# Patient Record
Sex: Male | Born: 1935 | Race: White | Hispanic: No | Marital: Single
Health system: Southern US, Community
[De-identification: ages and names within clinical notes are randomized; demographics above are authoritative.]

---

## 2012-02-24 LAB — CBC
HCT: 40.7 % (ref 40.0–52.0)
HGB: 13 g/dL (ref 13.0–18.0)
MCH: 30.7 pg (ref 26.0–34.0)
MCHC: 31.9 g/dL — ABNORMAL LOW (ref 32.0–36.0)
MCV: 96 fL (ref 80–100)
Platelet: 169 10*3/uL (ref 150–440)
RBC: 4.23 10*6/uL — ABNORMAL LOW (ref 4.40–5.90)
RDW: 18.1 % — ABNORMAL HIGH (ref 11.5–14.5)
WBC: 12.9 10*3/uL — ABNORMAL HIGH (ref 3.8–10.6)

## 2012-02-24 LAB — COMPREHENSIVE METABOLIC PANEL
Anion Gap: 11 (ref 7–16)
Bilirubin,Total: 0.6 mg/dL (ref 0.2–1.0)
Calcium, Total: 9.4 mg/dL (ref 8.5–10.1)
Chloride: 105 mmol/L (ref 98–107)
Co2: 20 mmol/L — ABNORMAL LOW (ref 21–32)
EGFR (African American): >60
EGFR (Non-African Amer.): >60
Sodium: 136 mmol/L (ref 136–145)

## 2012-02-24 LAB — ETHANOL
Ethanol %: 0.107 % — ABNORMAL HIGH (ref 0.000–0.080)
Ethanol: 107 mg/dL

## 2012-02-25 ENCOUNTER — Inpatient Hospital Stay: Payer: Self-pay | Admitting: Psychiatry

## 2012-02-25 LAB — DRUG SCREEN, URINE
Amphetamines, Ur Screen: NEGATIVE (ref ?–1000)
Barbiturates, Ur Screen: NEGATIVE (ref ?–200)
Benzodiazepine, Ur Scrn: POSITIVE (ref ?–200)
Cannabinoid 50 Ng, Ur ~~LOC~~: NEGATIVE (ref ?–50)
Cocaine Metabolite,Ur ~~LOC~~: NEGATIVE (ref ?–300)
MDMA (Ecstasy)Ur Screen: NEGATIVE (ref ?–500)
Methadone, Ur Screen: NEGATIVE (ref ?–300)
Opiate, Ur Screen: POSITIVE (ref ?–300)
Phencyclidine (PCP) Ur S: NEGATIVE (ref ?–25)

## 2012-02-25 LAB — URINALYSIS, COMPLETE
Bacteria: NONE SEEN
Bilirubin,UR: NEGATIVE
Blood: NEGATIVE
Glucose,UR: NEGATIVE mg/dL (ref 0–75)
Ketone: NEGATIVE
Nitrite: NEGATIVE
Protein: NEGATIVE
RBC,UR: 1 /HPF (ref 0–5)

## 2012-02-27 LAB — CBC WITH DIFFERENTIAL/PLATELET
Eosinophil %: 2.3 %
HCT: 36.5 % — ABNORMAL LOW (ref 40.0–52.0)
Lymphocyte #: 0.8 10*3/uL — ABNORMAL LOW (ref 1.0–3.6)
MCH: 31.5 pg (ref 26.0–34.0)
MCV: 96 fL (ref 80–100)
Monocyte #: 1.6 x10 3/mm — ABNORMAL HIGH (ref 0.2–1.0)
Monocyte %: 9.9 %
Neutrophil %: 82.1 %
Platelet: 119 10*3/uL — ABNORMAL LOW (ref 150–440)
RBC: 3.79 10*6/uL — ABNORMAL LOW (ref 4.40–5.90)
RDW: 17.6 % — ABNORMAL HIGH (ref 11.5–14.5)
WBC: 16.1 10*3/uL — ABNORMAL HIGH (ref 3.8–10.6)

## 2012-02-28 LAB — BASIC METABOLIC PANEL
Anion Gap: 11 (ref 7–16)
BUN: 22 mg/dL — ABNORMAL HIGH (ref 7–18)
Chloride: 103 mmol/L (ref 98–107)
Co2: 23 mmol/L (ref 21–32)
Creatinine: 1.22 mg/dL (ref 0.60–1.30)
EGFR (African American): >60
Glucose: 131 mg/dL — ABNORMAL HIGH (ref 65–99)
Osmolality: 279 (ref 275–301)
Sodium: 137 mmol/L (ref 136–145)

## 2012-02-28 LAB — CBC WITH DIFFERENTIAL/PLATELET
Basophil #: 0.1 10*3/uL (ref 0.0–0.1)
Basophil %: 0.4 %
Eosinophil #: 0.3 10*3/uL (ref 0.0–0.7)
Eosinophil %: 2.3 %
HGB: 12.2 g/dL — ABNORMAL LOW (ref 13.0–18.0)
Lymphocyte #: 0.7 10*3/uL — ABNORMAL LOW (ref 1.0–3.6)
Lymphocyte %: 5.2 %
MCH: 31.9 pg (ref 26.0–34.0)
MCHC: 33.2 g/dL (ref 32.0–36.0)
MCV: 96 fL (ref 80–100)
Monocyte %: 10.2 %
Neutrophil #: 11.7 10*3/uL — ABNORMAL HIGH (ref 1.4–6.5)
Neutrophil %: 81.9 %

## 2012-02-29 LAB — CBC WITH DIFFERENTIAL/PLATELET
Basophil #: 0.1 10*3/uL (ref 0.0–0.1)
Eosinophil #: 0.3 10*3/uL (ref 0.0–0.7)
HCT: 39.3 % — ABNORMAL LOW (ref 40.0–52.0)
HGB: 12.7 g/dL — ABNORMAL LOW (ref 13.0–18.0)
Lymphocyte #: 0.8 10*3/uL — ABNORMAL LOW (ref 1.0–3.6)
Lymphocyte %: 6.1 %
Monocyte #: 1.1 x10 3/mm — ABNORMAL HIGH (ref 0.2–1.0)
Neutrophil #: 10 10*3/uL — ABNORMAL HIGH (ref 1.4–6.5)
Platelet: 131 10*3/uL — ABNORMAL LOW (ref 150–440)
RBC: 4.03 10*6/uL — ABNORMAL LOW (ref 4.40–5.90)
RDW: 18.1 % — ABNORMAL HIGH (ref 11.5–14.5)
WBC: 12.3 10*3/uL — ABNORMAL HIGH (ref 3.8–10.6)

## 2012-02-29 LAB — BASIC METABOLIC PANEL
BUN: 22 mg/dL — ABNORMAL HIGH (ref 7–18)
Calcium, Total: 9.6 mg/dL (ref 8.5–10.1)
Creatinine: 1.18 mg/dL (ref 0.60–1.30)
EGFR (African American): >60
EGFR (Non-African Amer.): 59 — ABNORMAL LOW
Glucose: 102 mg/dL — ABNORMAL HIGH (ref 65–99)
Potassium: 4 mmol/L (ref 3.5–5.1)
Sodium: 139 mmol/L (ref 136–145)

## 2012-02-29 LAB — AMMONIA: Ammonia, Plasma: 40 mcmol/L — ABNORMAL HIGH (ref 11–32)

## 2013-02-23 IMAGING — CT CT HEAD WITHOUT CONTRAST
2 series · 16 of 30 positions shown, 20 images · non-contrast
Comparison: none

REASON FOR EXAM: post fall - hit head
COMMENTS:

PROCEDURE:     CT  - CT HEAD WITHOUT CONTRAST  - February 26, 2012 [DATE]
RESULT:     Comparison:  None
TECHNIQUE: Multiple axial images from the foramen magnum to the vertex were
obtained without IV contrast.

[Series 2: without · axial · non-contrast · 0.43mm/px · z∈[-129,+1]mm · 13 of 32 slices shown, 17 images]
[im 3/32  brain]
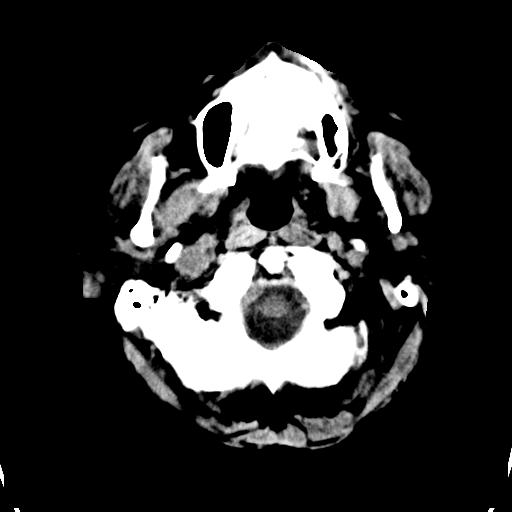
[im 3/32  bone]
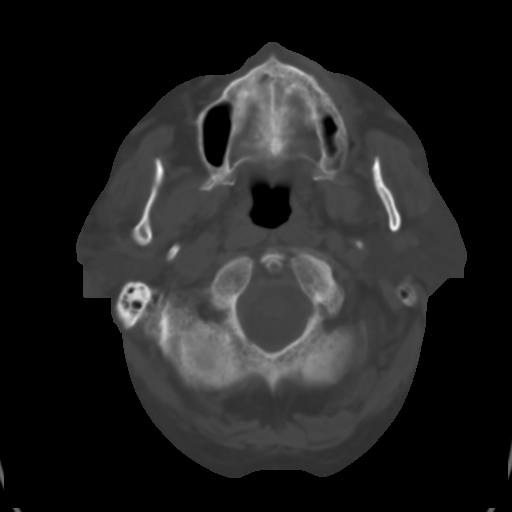
[im 5/32  brain]
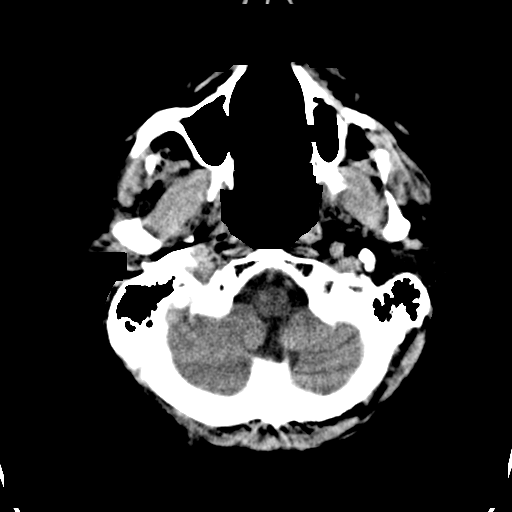
[im 7/32  brain]
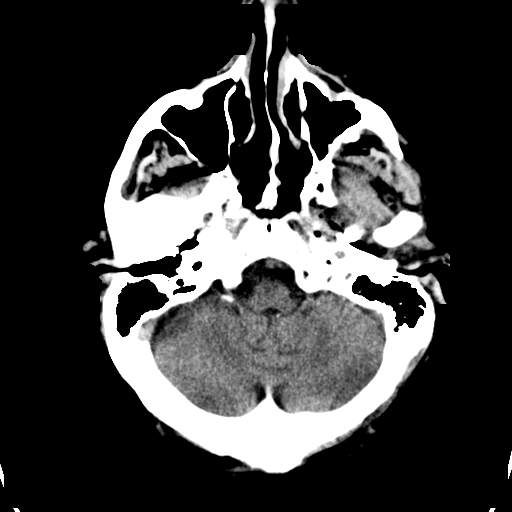
[im 9/32  brain]
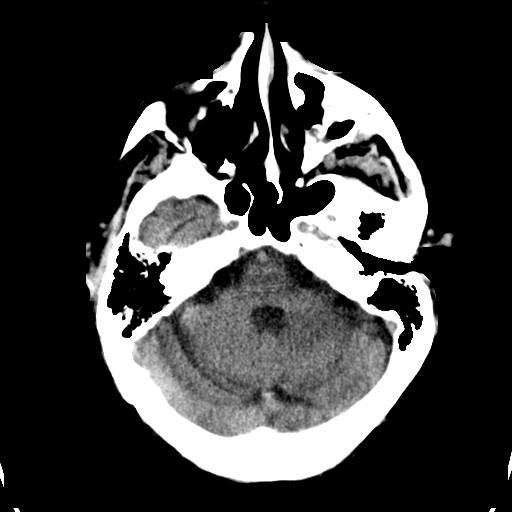
[im 12/32  brain]
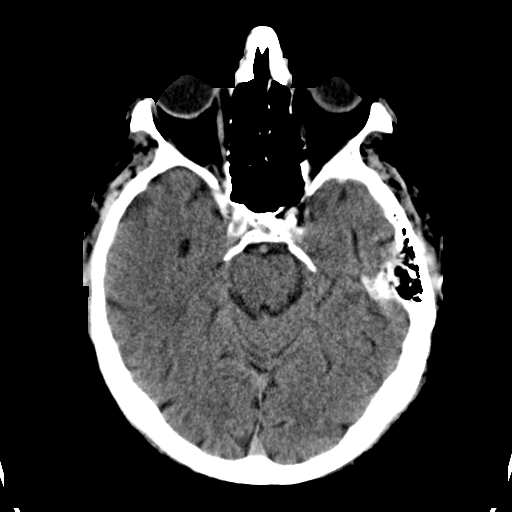
[im 12/32  bone]
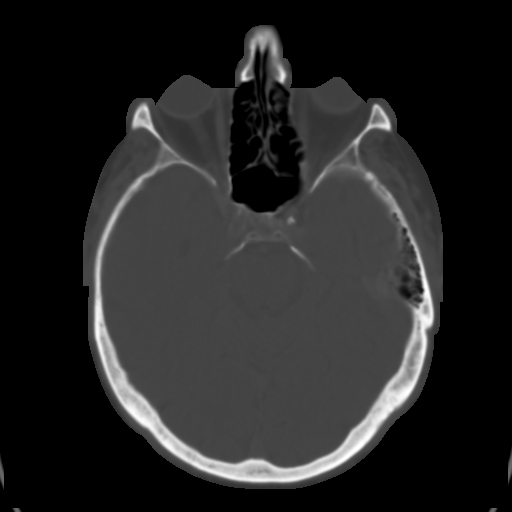
[im 14/32  brain]
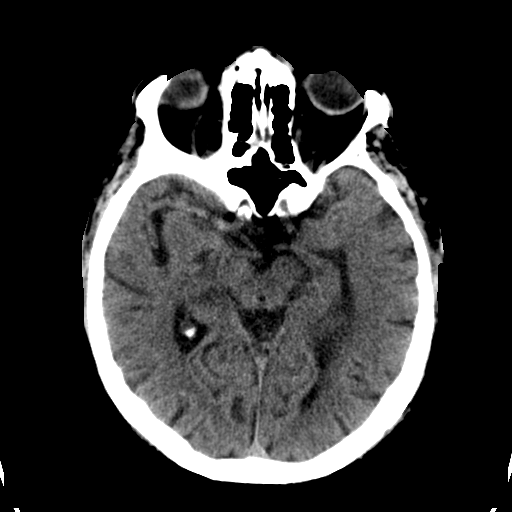
[im 16/32  brain]
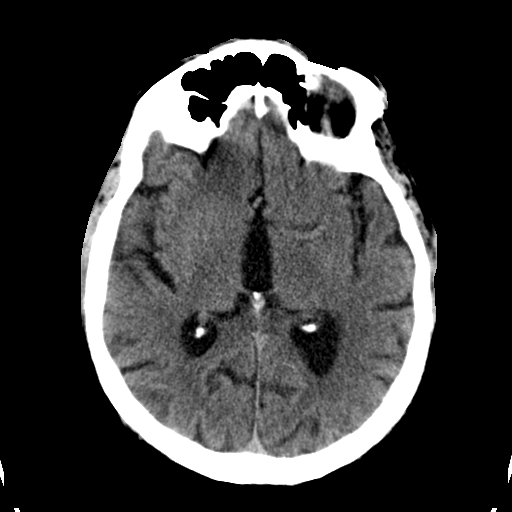
[im 18/32  brain]
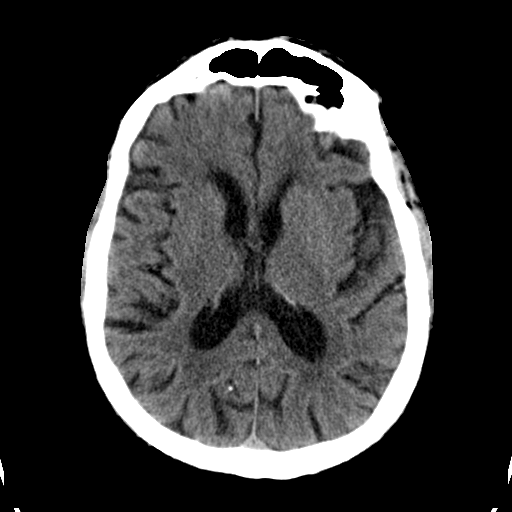
[im 20/32  brain]
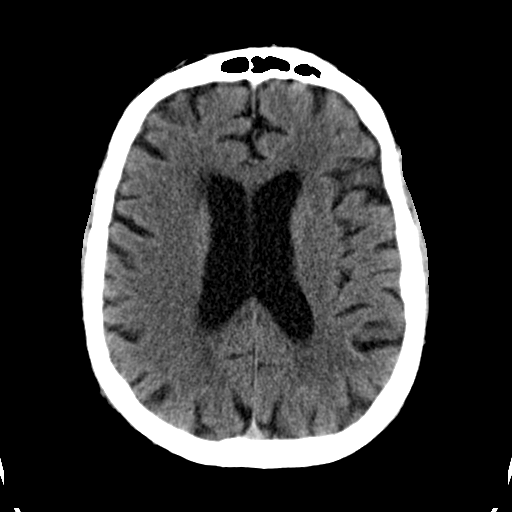
[im 20/32  bone]
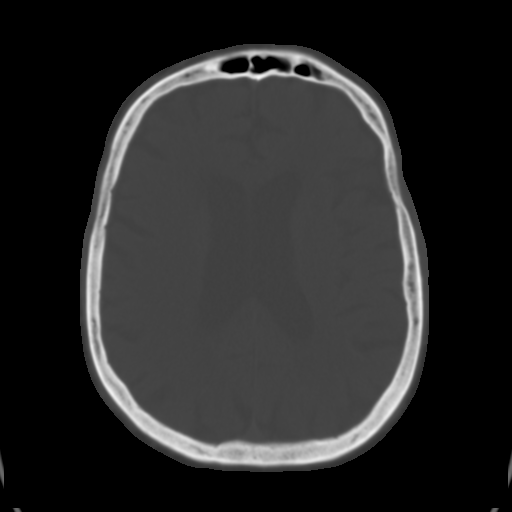
[im 23/32  brain]
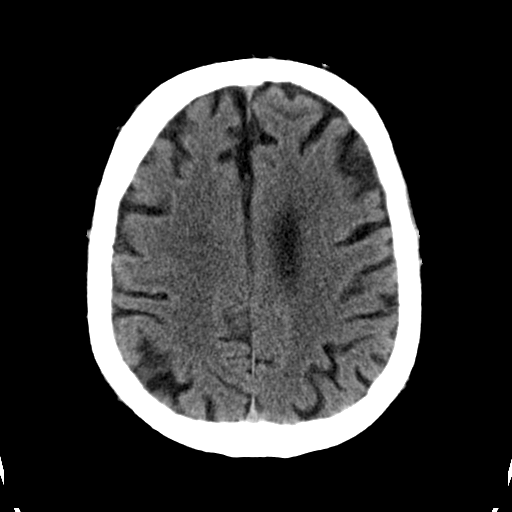
[im 25/32  brain]
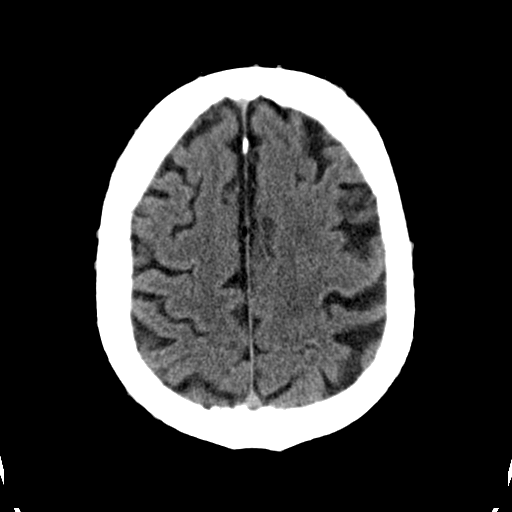
[im 27/32  brain]
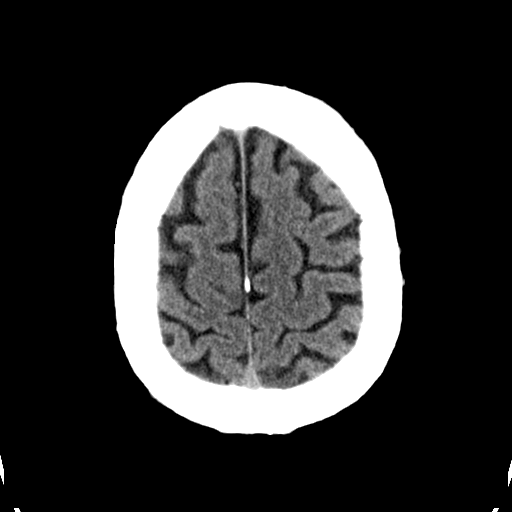
[im 29/32  brain]
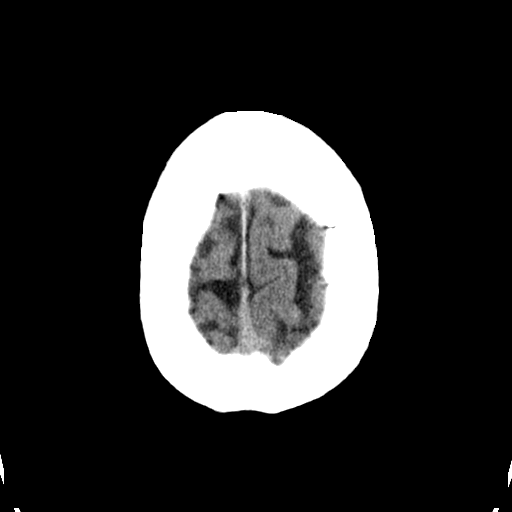
[im 29/32  bone]
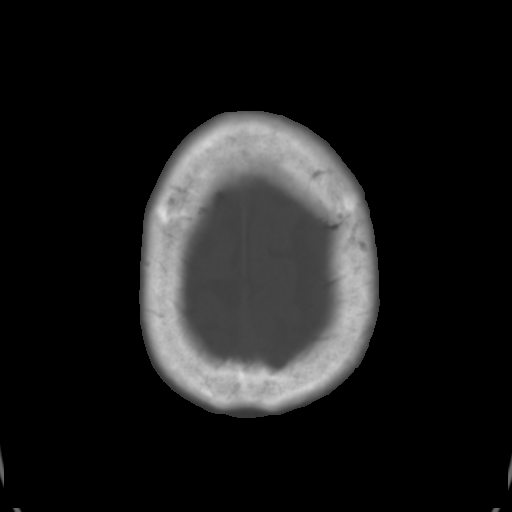

[Series 3: bone · axial · 0.43mm/px · z∈[-129,-84]mm · 3 of 32 slices shown]
[im 3/32  bone]
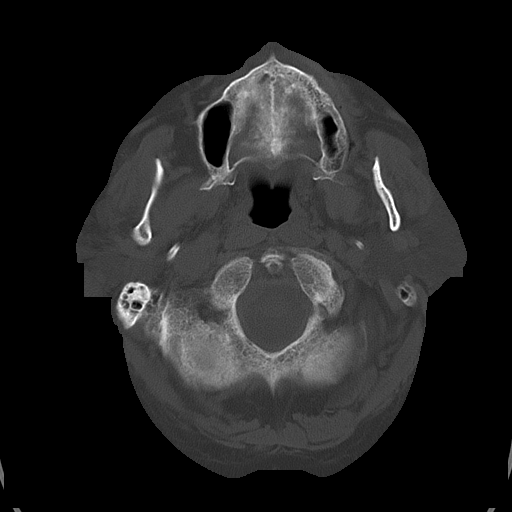
[im 7/32  bone]
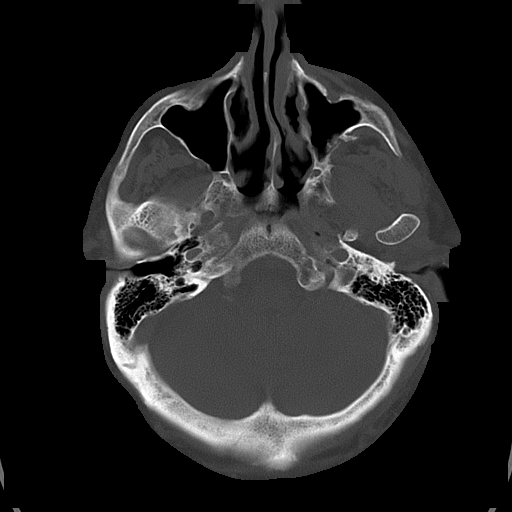
[im 12/32  bone]
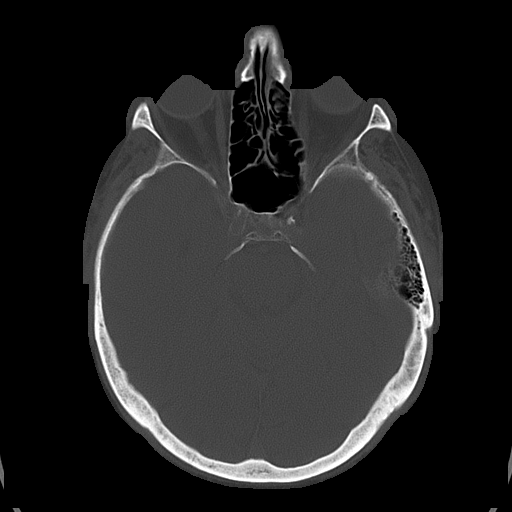

[16 of 30 positions shown; findings below may reference images not displayed]

FINDINGS: There is no evidence for mass effect, midline shift, or extra-axial fluid
collections. There is no evidence for space-occupying lesion, intracranial
hemorrhage, or cortical-based area of infarction. Mild periventricular
hypoattenuation is likely sequela of chronic microangiopathy.

Small round sclerotic density in the right superior orbital in is likely
secondary to a bone island.
IMPRESSION: No acute intracranial process.

[REDACTED]

## 2013-02-24 IMAGING — CR DG CHEST 1V
1 series · 2 of 2 positions shown · non-contrast
Comparison: none

REASON FOR EXAM: followup
COMMENTS:

[Series 1: x chest ap · 0.14mm/px · 2 of 2 slices shown]
[im 1/2]
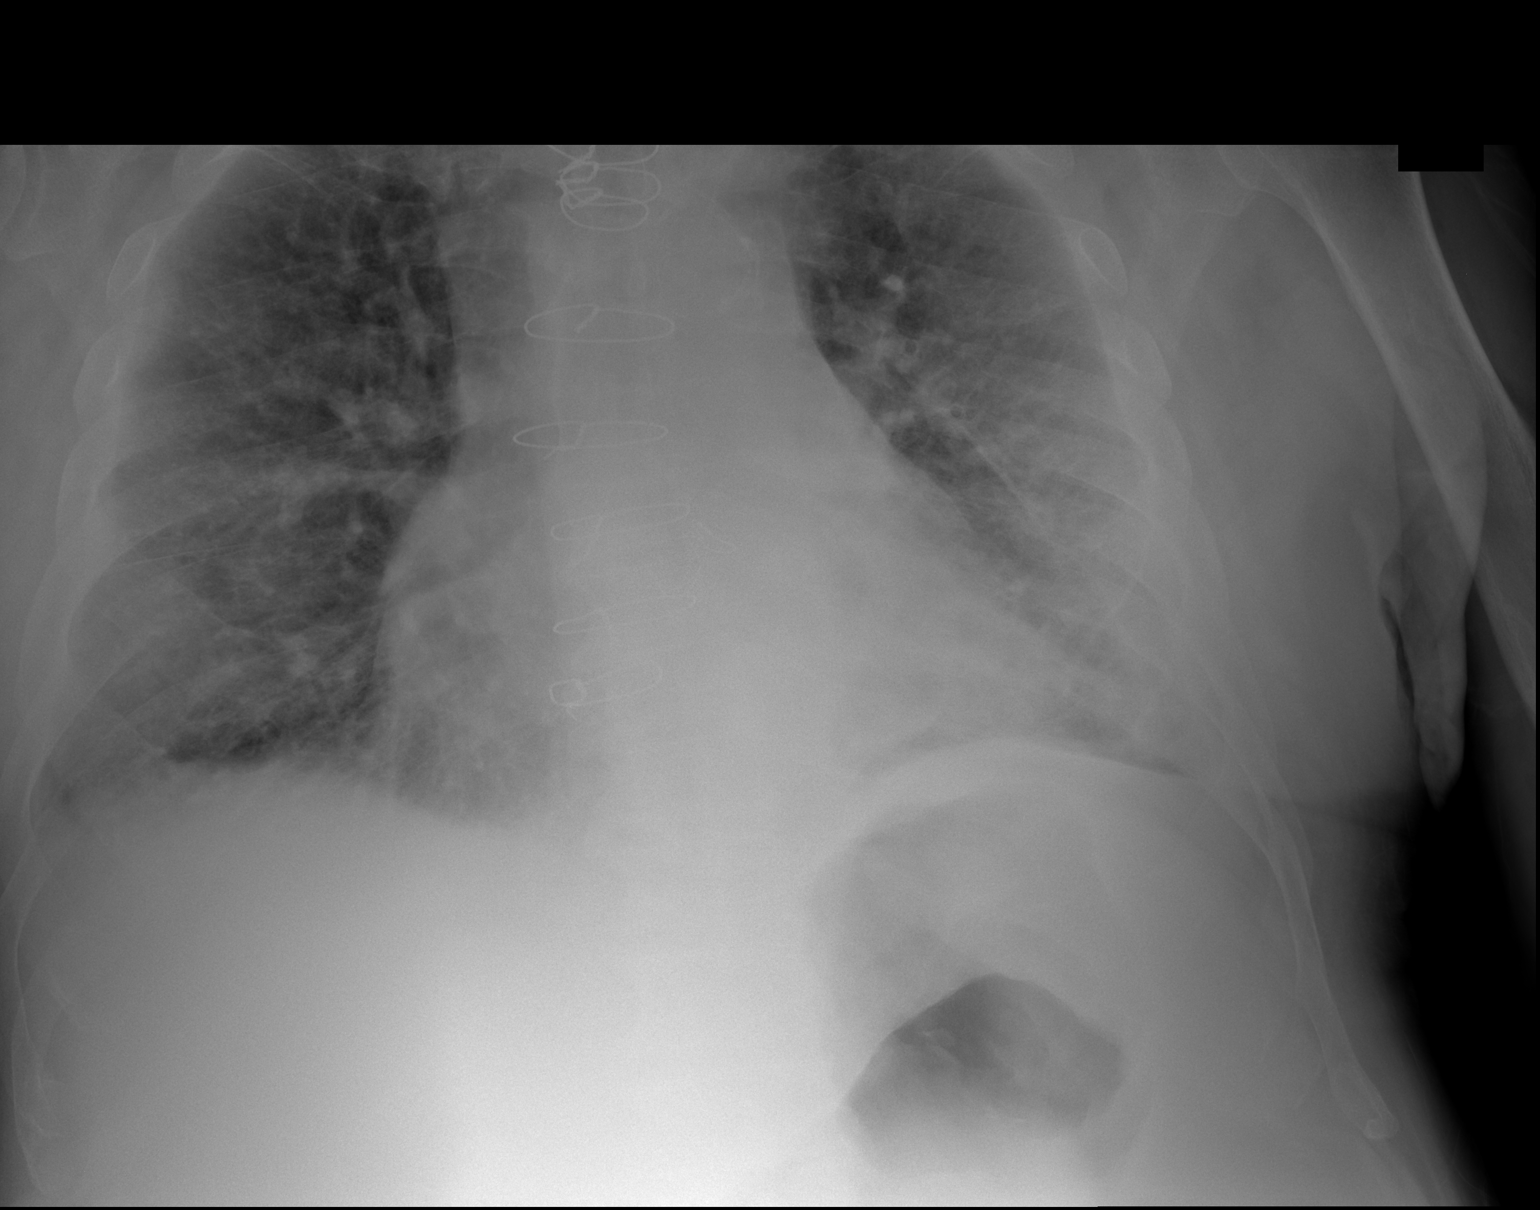
[im 2/2]
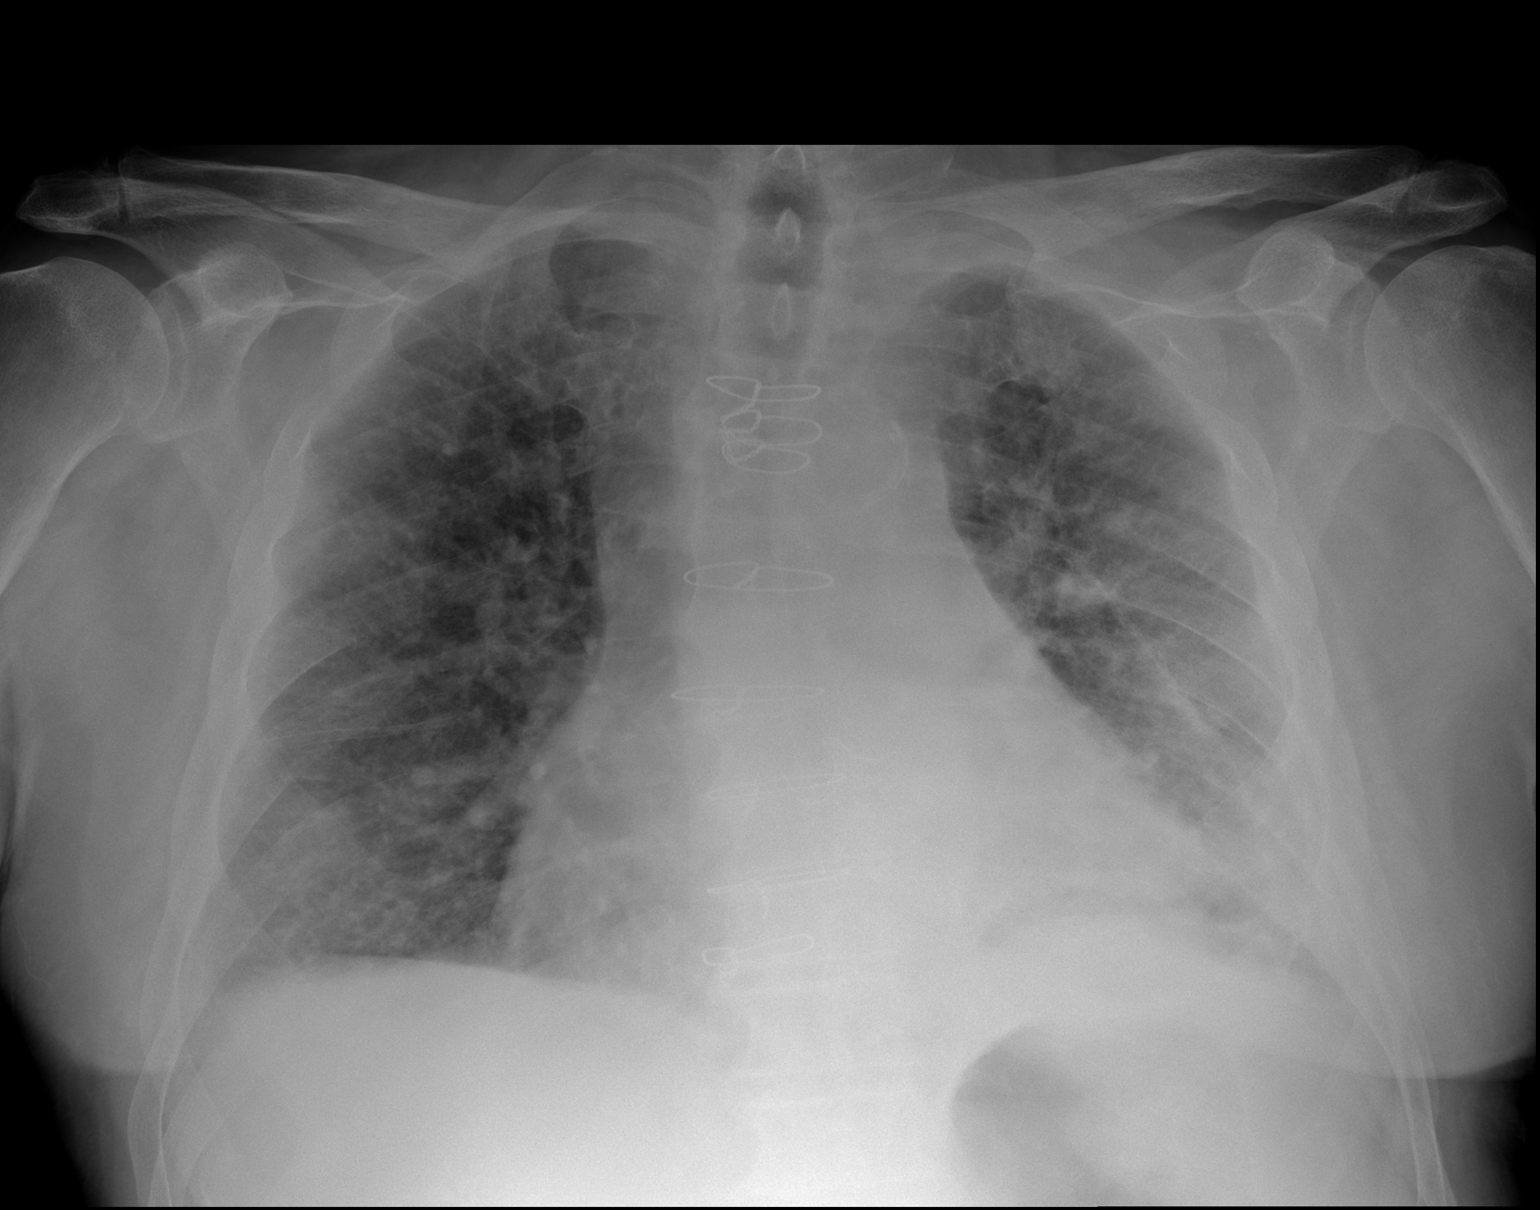

[2 of 2 positions shown; findings below may reference images not displayed]

PROCEDURE:     DXR - DXR CHEST 1 VIEWAP OR PA  - February 27, 2012  [DATE]

RESULT:     Comparison is made to study 24 February, 2012.

The cardiac silhouette is enlarged. There is thickening of the minor
fissure. There is interstitial edema with peribronchial cuffing. No focal
consolidation is evident. There is no pneumothorax or mediastinal shift.
IMPRESSION: 1. Findings consistent with congestive heart failure. Correlate clinically.

[REDACTED]

## 2014-02-26 DEATH — deceased

## 2014-12-21 NOTE — H&P (Signed)
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 The patient is widowed and lives by himself in a house in [place], [place]. The patient comes for his first inpatient hospitalization in psychiatry at [facility] at [facility] with the chief complaint, "I cannot continue like this. I need help. I need to get detox for my drinking".   HISTORY OF PRESENT ILLNESS:  The patient reports that he has been drinking at the rate of six to eight beers or three White Russians per day for the past eight months or so. In addition, he reports a couple of days he will not drink and then he starts drinking three White Russians and then he may change it to six to eight beers. This has been going on for eight months and decided that he needed help.   PAST PSYCHIATRIC HISTORY: He was an inpatient in [place] many years ago for alcohol detox and stayed sober for quite some time, then started drinking again for mostly sick reason. No history of DTs, tremors, seizures or blackouts. He did have two DWIs, lost his driver's license, got it back, never arrested for public drunkenness. No history of suicide attempts.  FAMILY HISTORY OF MENTAL ILLNESS: One of his great grandmothers had some mental illness.  No known history of suicides in the family.   FAMILY HISTORY:  Raised by parents. Father tried to be a Medical illustrator. Father died of emphysema at an old age. Mother died at age 80 years with cerebrovascular accident. He has one brother and two sisters, not close to family.   PERSONAL HISTORY: Born in [place], [place]. Moved to [place] later. Moved from [place] to [place] after retirement because he could not afford to stay in [place] anymore. Graduated from  high school. Took some college courses.   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   MILITARY: He was in the [employer] and then was in the [employer], honorably discharged.   MARRIAGES: Married once. Marriage lasted until she died. He has one son who is 5 years old. He is in touch with him and calls him sometimes.   ALCOHOL AND DRUGS: First drink of alcohol at age 69 years. It became a problem later on in life. As stated above, he started drinking and got help from detox many years ago and stayed sober for a while and started drinking again. No specific reasons but states stress. He had two DWIs and lost his license. Got it back as stated above. Never arrested for public drunkenness. No history of DTs, tremors, seizures or blackouts. Denies any street or prescription drug abuse. Denies using IV drugs. Denies smoking nicotine cigarettes.   PAST MEDICAL HISTORY:  No known history of high blood pressure. No known history of diabetes mellitus. Status post heart valve replacement and cardiomegaly. He has asbestosis of lung. He has spots in the lung on the right side. 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 History of atrial fibrillation.   CURRENT MEDICATIONS:  1. Allopurinol 300 milligrams once daily for gout. 2. Aspirin 325 milligrams daily.  3. Betamethasone clotrimazole 0.5% topical cream. 4. Combivent Respimat CFC free 100 mcg/20 mcg inhalation aerosol.  5. Fish oil 1,000 milligrams daily. 6. Folic  acid 1 milligram daily.  7. Furosemide 40 milligrams daily.  8. Lortab 5/500 milligrams daily.  9. Metoprolol tartrate 50 milligrams twice daily. 10. Multivitamin once daily.  11. NitroQuick 0.4 tablets every four hours as needed for chest pain. 12. Simvastatin 40 milligrams once daily. 13. Spironolactone 25 milligrams once daily. 14. Valium 5 milligrams three times daily.  15. Venlafaxine 75 milligrams one twice daily.  16. Vitamin B 100  milligrams daily.   PRIMARY CARE PHYSICIAN: He is being followed by Dr. Rolene Arbour at [facility].  Last appointment was two months ago. Next appointment is coming up in July 2013.   PHYSICAL EXAMINATION:    VITAL SIGNS: Temperature afebrile, 98.4. Heart: Atrial fibrillation, irregularly irregular. Blood pressure 126/82. Respirations 18 per minute, regular.   HEENT: Head is normocephalic and atraumatic. EYES: Pupils are equal, round and reactive to light and accommodation. Fundi bilaterally benign. Extraocular movements visualized. Tympanic membranes visualized.   NECK: Supple without any organomegaly or thyromegaly.   CHEST: Normal expansion. Has pleural effusion on the left side.   HEART: Has irregular irregular heart rate. Heart is enlarged and has cardiomegaly.   ABDOMEN: Very obese, but soft. Bowel sounds heard.   RECTAL: Deferred.   NEUROLOGIC: Gait: The patient is in a wheelchair. The patient has unsteady gait and needs a walker or a wheelchair for stability. Cranial nerves 2-12 grossly intact and normal. No motor deficit. No sensory deficit. Plantars are normal response.   SKIN: Normal turgor. No rash. Warm and dry.   EXTREMITIES: Nontender. No pedal edema.   MENTAL STATUS EXAMINATION: The patient is dressed in street clothes, alert and oriented to place and person, but not exactly to time. He said it was 03/04/2012 which was wrong as it was 02/25/2012. He knew the name of the current president and previous president. Denies feeling depressed. Denies feeling hopeless or helpless. Denies feeling worthless or useless. Denies any ideas or attempts to hurt himself or others. Denies any psychotic symptoms. Denies auditory or visual hallucinations. Denies hearing voices. Denies any paranoid or suspicious ideas. Could spell the word world forward, but could not spell it backward. Regarding memory and recall, he could remember one of the three objects and the second one he could  repeat with prompting and help.  He could count money. Abstract interpretation is fair for level of education. Does admit to appetite and sleep disturbance. He reports that this happens when he drinks alcohol. Insight and judgement guarded.   IMPRESSION: AXIS I:  1. Alcohol dependence, chronic, continuous.  2. Substance induced mood disorder.   AXIS II: Deferred.   AXIS III: 1. Cardiomegaly. 2. Left lower pleural effusion.  3. Asbestosis of lungs. 4. Status post valve replacement and surgery. 5. High cholesterol.  6. Gastroesophageal reflux disease.  7. Status post cancer of bladder removed.  8. Atrial fibrillation.   AXIS IV: Severe, lives alone and has been drinking alcohol as stated above and has multiple physical problems.   AXIS V: Global Assessment of Functioning 25.   PLAN: The patient is admitted to [facility] for close observation, evaluation and help. He will be started on CIWA protocol for alcohol detox. He will be continued on the rest of the medications as stated above and prescribed by his family doctor. During the stay in the hospital he will be given milieu therapy and supportive counseling. He will take part in individual and group therapy where substance abuse will be addressed with specific  attention to alcohol. At the time of discharge detox will be completed. He will not be having any withdrawal symptoms and he will be reported to appropriate program in the community so that he will continue to stay sober.   ____________________________ Jannet MantisSurya K. Guss Bundehalla, MD skc:ap D: 02/25/2012 17:47:43 ET T: 02/26/2012 08:43:04 ET JOB#: [id number]  cc: Monika SalkSurya K. Guss Bundehalla, MD, <Dictator> Beau FannySURYA K Collie Kittel MD ELECTRONICALLY SIGNED 02/26/2012 15:31

## 2014-12-21 NOTE — Consult Note (Signed)
PATIENT NAME:  Shane Martinez, Shane Martinez MR#:  [id number] DATE OF BIRTH:  1931/12/17  DATE OF CONSULTATION:  02/27/2012  REFERRING PHYSICIAN:  Caryn Section, MD  CONSULTING PHYSICIAN:  Katha Hamming, MD PRIMARY CARE PHYSICIAN: Out of town.  CHIEF COMPLAINT: The patient is admitted to the Psychiatry Service, and we are asked to see the patient for left pleural effusion.   HISTORY OF PRESENT ILLNESS: The patient is a 81 year old male with history of hypertension, chronic obstructive pulmonary disease, and positive alcohol cirrhosis, and history of atrial fibrillation, admitted to the Psychiatric Unit for alcohol detox. The patient is on CIWA  protocol. I was asked to see the patient for a pleural effusion on the left side. The patient complains of some cough but no trouble breathing. No orthopnea, no PND, no pedal edema, no chest pain.   PAST MEDICAL HISTORY: Significant for: 1. Asbestosis of the lungs. 2. History of high cholesterol. 3. Gastroesophageal reflux disease. 4. Atrial fibrillation. 5. Left pleural effusion.   SOCIAL HISTORY: Heavy alcoholic. The patient denies any smoking or tobacco abuse.   CURRENT MEDICATIONS:  1. Folic acid 1 mg daily. 2. Haldol 2 mg IM every 4 hours p.r.n. for agitation.  3. Ativan 2 mg every 1 hour p.r.n. for agitation.  4. Multivitamin 1 tablet p.o. daily. 5. Albuterol inhalation 1 puff q.i.d.  6. Allopurinol 300 mg p.o. daily.  7. Amoxicillin 250 mg every 8 hours.  8. Aspirin 325 mg p.o. daily.  9. Clonidine 0.1 mg every 3 hours p.r.n.  for hypertension.  10. Ativan 2 mg IM every 2 hours p.r.n. as per CIWA protocol.  11. Metoprolol 50 mg p.o. b.i.d.  12. Promethazine suppository 25 mg every 4 hours p.r.n. for nausea and vomiting. 13. Simvastatin 40 mg daily.  14. Lasix 40 mg p.o. b.i.d. Lasix also was increased by me.  He was on 40 mg daily, and I increased to 40 mg b.i.d.   15. Aldactone 50 mg p.o. daily. That is increased by me. He was on 25 mg daily and  increased to 50 mg daily. 16. Thiamine 50 mg daily.   ALLERGIES: Amiodarone.  REVIEW OF SYSTEMS: CONSTITUTIONAL: The patient has no fever and has unstable gait. His mood is within normal limits. EYES: No blurred vision. ENT: No tinnitus. No epistaxis. No difficulty swallowing. RESPIRATORY: Has some cough, no trouble breathing. CARDIOVASCULAR: No chest pain, no palpitation, no orthopnea, no PND. No nausea or vomiting. ABDOMEN: Distended with ascites. GENITOURINARY: No dysuria. ENDOCRINE: No polydipsia or nocturia. INTEGUMENTARY: No skin rashes. MUSCULOSKELETAL: No joint pain. NEUROLOGIC: No numbness. The patient has generalized weakness and is having trouble walking   and is in a wheelchair. PSYCHIATRIC: He is admitted for alcohol detox.    PHYSICAL EXAMINATION:  VITAL SIGNS: Temperature 97.8, pulse 65, respirations 20, blood pressure 110/66, saturation 93% on room air.   GENERAL: He is an alert, awake, oriented, pleasant male not in distress, answering questions appropriately.   HEENT: Head is atraumatic, normocephalic. Eyes: Pupils are equally reacting to light. Extraocular muscles are intact. ENT: No tympanic membrane congestion. No turbinate hypertrophy. No oropharyngeal erythema.   NECK: Normal range of motion. No JVD. No carotid bruits.   CARDIOVASCULAR: S1 and S2 regular.   LUNGS: Decreased breath sounds at bases. No wheeze. No rales.   ABDOMEN: Soft, nontender, nondistended. Bowel sounds are present. The patient does have some ascites.  EXTREMITIES: The patient has 1+ edema. No cyanosis. No clubbing.   LABORATORY, DIAGNOSTIC AND RADIOLOGICAL DATA:  Ammonia  45.  EKG showed atrial fibrillation with 84 beats per minute. No ST-T changes.  WBC slightly elevated at 16.1, hemoglobin 11.9, hematocrit 36.3, platelets 119.  Chest x-ray this morning showed congestive heart failure. No consolidation or pneumothorax.   ASSESSMENT AND PLAN: The patient is a 81 year old male with: 1. Cough and  evidence of congestive heart failure on x-ray: May be coming from his alcoholic cirrhosis with possible right heart failure. He is not symptomatic and not hypoxic, but because of cough and evidence of congestive heart failure I am going to increase Lasix and also Aldactone and follow the echocardiogram. He may benefit from a low salt diet and checking daily weights. 2. Possible alcoholic cirrhosis with ascites:  He is already on Lasix, Aldactone, thiamine, folic acid. Continue and consider   abdominal sonogram to see the ascites, how much he has. Hopefully the Lasix and Aldactone will help him with the symptoms of cough and abdominal distention.  3. Hypertension: Controlled with beta blockers, Lasix, Aldactone.  4. Chronic obstructive pulmonary disease: The patient has no wheezing, so continue the Albuterol.  5. Decreased urination per nurse: She report that the patient voided only once this morning. The patient says he has not been drinking lately, and he does not feel like going. At this time, we will watch and check BMP tomorrow.   Condition is stable. Thank you for asking us to see the patient. We will follow up with you.    ____________________________ Katha HammingSnehalatha Tuwanda Vokes, MD sk:cbb D: 02/27/2012 20:40:59 ET T: 02/28/2012 10:29:51 ET JOB#: [id number]  cc: Katha HammingSnehalatha Gabriele Zwilling, MD, <Dictator> Katha HammingSNEHALATHA Darl Brisbin MD ELECTRONICALLY SIGNED 03/06/2012 10:16

## 2014-12-21 NOTE — Consult Note (Signed)
Brief Consult Note: Diagnosis: 81 yr old male with h/o heavyalcohol abuse found to have plural effusion,pt denies sob,but has cough.no chest pain,no orthopnoea or PND.   Patient was seen by consultant.   Consult note dictated.   Orders entered.   Comments: 81 yr old male with CHF,pleural effusion:likely coming from ascites/cirrhossi with heavy alcohol use; increase lasix,check echo,continue spironolactone , h/o chronic obstructive pulmonary disease:stable,no wheezing,recommend continuing smae meds alcohol abuse :on CIWA protocol deconditioning;will benefit from PT slightly elavated ammonia:not encephalopathic. oliguria:nurse reports he voided only once since this am,but he says he does not drink enough water,monitor  for now,check BMP am.  Electronic Signatures: Katha HammingKonidena, Chailyn Racette (MD)  (Signed 01-Jul-13 20:31)  Authored: Brief Consult Note   Last Updated: 01-Jul-13 20:31 by Katha HammingKonidena, Braedon Sjogren (MD)
